# Patient Record
Sex: Female | Born: 2005 | Race: White | Hispanic: No | Marital: Single | Smoking: Never smoker
Health system: Southern US, Community
[De-identification: ages and names within clinical notes are randomized; demographics above are authoritative.]

## PROBLEM LIST (undated history)

## (undated) DIAGNOSIS — F909 Attention-deficit hyperactivity disorder, unspecified type: Secondary | ICD-10-CM

---
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
## 2023-11-25 DIAGNOSIS — F988 Other specified behavioral and emotional disorders with onset usually occurring in childhood and adolescence: Secondary | ICD-10-CM | POA: Diagnosis not present

## 2023-12-14 DIAGNOSIS — R0781 Pleurodynia: Secondary | ICD-10-CM | POA: Diagnosis not present

## 2023-12-14 DIAGNOSIS — R1011 Right upper quadrant pain: Secondary | ICD-10-CM | POA: Diagnosis not present

## 2024-01-27 DIAGNOSIS — J309 Allergic rhinitis, unspecified: Secondary | ICD-10-CM | POA: Diagnosis not present

## 2024-01-27 DIAGNOSIS — R0981 Nasal congestion: Secondary | ICD-10-CM | POA: Diagnosis not present
